# Patient Record
Sex: Male | Born: 1970
Health system: Southern US, Community
[De-identification: ages and names within clinical notes are randomized; demographics above are authoritative.]

## PROBLEM LIST (undated history)

## (undated) HISTORY — PX: APPENDECTOMY: SHX54

## (undated) HISTORY — PX: TONSILLECTOMY: SUR1361

---

## 2005-02-13 ENCOUNTER — Emergency Department (HOSPITAL_COMMUNITY): Admission: EM | Admit: 2005-02-13 | Discharge: 2005-02-13 | Payer: Self-pay | Admitting: Emergency Medicine

## 2014-10-18 ENCOUNTER — Emergency Department (HOSPITAL_COMMUNITY)
Admission: EM | Admit: 2014-10-18 | Discharge: 2014-10-18 | Disposition: A | Discharge status: Home / Self Care | Payer: Self-pay | Attending: Emergency Medicine | Admitting: Emergency Medicine

## 2014-10-18 ENCOUNTER — Encounter (HOSPITAL_COMMUNITY): Payer: Self-pay | Admitting: Emergency Medicine

## 2014-10-18 DIAGNOSIS — Y998 Other external cause status: Secondary | ICD-10-CM | POA: Insufficient documentation

## 2014-10-18 DIAGNOSIS — H109 Unspecified conjunctivitis: Secondary | ICD-10-CM | POA: Insufficient documentation

## 2014-10-18 DIAGNOSIS — T1501XA Foreign body in cornea, right eye, initial encounter: Secondary | ICD-10-CM | POA: Insufficient documentation

## 2014-10-18 DIAGNOSIS — Y9389 Activity, other specified: Secondary | ICD-10-CM | POA: Insufficient documentation

## 2014-10-18 DIAGNOSIS — Y9289 Other specified places as the place of occurrence of the external cause: Secondary | ICD-10-CM | POA: Insufficient documentation

## 2014-10-18 DIAGNOSIS — Z720 Tobacco use: Secondary | ICD-10-CM | POA: Insufficient documentation

## 2014-10-18 DIAGNOSIS — X58XXXA Exposure to other specified factors, initial encounter: Secondary | ICD-10-CM | POA: Insufficient documentation

## 2014-10-18 DIAGNOSIS — T1591XA Foreign body on external eye, part unspecified, right eye, initial encounter: Secondary | ICD-10-CM

## 2014-10-18 MED ORDER — NEOMYCIN-POLYMYXIN-HC 3.5-10000-1 OP SUSP: 3.0000 [drp] | Freq: Four times a day (QID) | OPHTHALMIC | Status: DC

## 2014-10-18 MED ORDER — TETRACAINE HCL 0.5 % OP SOLN
2.0000 [drp] | Freq: Once | OPHTHALMIC | Status: AC
  Administered 2014-10-18: 2 [drp] via OPHTHALMIC
  Filled 2014-10-18: qty 2

## 2014-10-18 MED ORDER — FLUORESCEIN SODIUM 1 MG OP STRP
1.0000 | ORAL_STRIP | Freq: Once | OPHTHALMIC | Status: AC
  Administered 2014-10-18: 1 via OPHTHALMIC
  Filled 2014-10-18: qty 1

## 2014-10-18 NOTE — ED Notes (Signed)
To ED from home, right eye sclera is reddened. Was working under a car and felt something fell in eye.

## 2014-10-18 NOTE — ED Provider Notes (Signed)
CSN: [id number]     Arrival date & time 10/18/14  1106 History  This chart was scribed for non-physician practitioner, Marlon Pel, PA-C working with Jerelyn Scott, MD, by Jarvis Morgan, ED Scribe. This patient was seen in room TR04C/TR04C and the patient's care was started at 12:25 PM.       Chief Complaint  Patient presents with  . Conjunctivitis     The history is provided by the patient. No language interpreter was used.    HPI Comments: Kirk Reeves is a 44 y.o. male who presents to the Emergency Department complaining constant, moderate, right eye pain onset 2 days ago due to a possible foreign body. Pt states he was working under a car and felt something fall into his eye. He reports associated right eye redness, itchiness, blurred vision and yellowish discharge. He endorses this morning there was a yellow crust that formed along the lash line. Pt states he has not taken any meds PTA. The pain is not exacerbated with eye movement. He denies any eye injury. Pt wears glasses.  He has an eye doctor that he follows up with regularly. He denies any photophobia.  History reviewed. No pertinent past medical history. Past Surgical History  Procedure Laterality Date  . Appendectomy    . Tonsillectomy     No family history on file. Social History  Substance Use Topics  . Smoking status: Current Every Day Smoker -- 0.50 packs/day    Types: Cigarettes  . Smokeless tobacco: None  . Alcohol Use: Yes     Comment: daily-- 6 pack    Review of Systems  Eyes: Positive for pain, discharge, redness, itching and visual disturbance (blurred). Negative for photophobia.  All other systems reviewed and are negative.     Allergies  Review of patient's allergies indicates not on file.  Home Medications   Prior to Admission medications   Medication Sig Start Date End Date Taking? Authorizing Provider  neomycin-polymyxin-hydrocortisone (CORTISPORIN) 3.5-10000-1 ophthalmic suspension Place  3 drops into the right eye 4 (four) times daily. For 7 days 10/18/14   Marlon Pel, PA-C   Triage Vitals: BP 148/98 mmHg  Pulse 82  Temp(Src) 98.7 F (37.1 C) (Oral)  Resp 16  Ht 5\' 10"  (1.778 m)  Wt 215 lb (97.523 kg)  BMI 30.85 kg/m2  SpO2 98%  Physical Exam  Constitutional: He is oriented to person, place, and time. He appears well-developed and well-nourished. No distress.  HENT:  Head: Normocephalic and atraumatic.  Eyes: EOM and lids are normal. Pupils are equal, round, and reactive to light. Right conjunctiva is injected.  Slit lamp exam:      The right eye shows corneal abrasion (2 small corneal abrasions at 7:00 and 9:00 right outside sclera), foreign body and fluorescein uptake.    Visual Acuity - Bilateral Distance: 20/15 ; R Distance: 20/20 ; L Distance: 20/15  FB noted, eyelash to base of eye.  Neck: Neck supple. No tracheal deviation present.  Cardiovascular: Normal rate.   Pulmonary/Chest: Effort normal. No respiratory distress.  Musculoskeletal: Normal range of motion.  Neurological: He is alert and oriented to person, place, and time.  Skin: Skin is warm and dry.  Psychiatric: He has a normal mood and affect. His behavior is normal.  Nursing note and vitals reviewed.   ED Course  FOREIGN BODY REMOVAL Date/Time: 10/18/2014 12:51 PM Performed by: Marlon Pel Authorized by: Marlon Pel Consent: Verbal consent obtained. Risks and benefits: risks, benefits and alternatives were discussed Consent  given by: patient Patient understanding: patient states understanding of the procedure being performed Patient identity confirmed: verbally with patient Body area: eye Location details: right conjunctiva Anesthesia: see MAR for details Local anesthetic: tetracaine drops Anesthetic total: 2 drops Patient sedated: no Localization method: eyelid eversion Eye examined with fluorescein. Fluorescein uptake. Corneal abrasion size: small Corneal abrasion  location: lateral No residual rust ring present. Dressing: antibiotic drops Depth: superficial Complexity: simple 1 objects recovered. Objects recovered: eye liash Post-procedure assessment: foreign body removed Patient tolerance: Patient tolerated the procedure well with no immediate complications   (including critical care time)  DIAGNOSTIC STUDIES: Oxygen Saturation is 98% on RA, normal by my interpretation.    COORDINATION OF CARE: 12:50 PM- Pt advised of plan for treatment and pt agrees. Will prescribe pt with  neomycin-polymyxin-hydrocortisone (CORTISPORIN) 3.5-10000-1 ophthalmic suspension Place 3 drops into the right eye 4 (four) times daily. For 7 days 7.5 mL Marlon Pel, PA-C   PT will follow-up with his eye doctor before the weekend  Labs Review Labs Reviewed - No data to display  Imaging Review No results found. I have personally reviewed and evaluated these images and lab results as part of my medical decision-making.   EKG Interpretation None      MDM   Final diagnoses:  Conjunctivitis of right eye  Eye foreign body, right, initial encounter    Medications  fluorescein ophthalmic strip 1 strip (1 strip Both Eyes Given by Other 10/18/14 1217)  tetracaine (PONTOCAINE) 0.5 % ophthalmic solution 2 drop (2 drops Both Eyes Given 10/18/14 1217)    44 y.o.Kirk Reeves's evaluation in the Emergency Department is complete. It has been determined that no acute conditions requiring further emergency intervention are present at this time. The patient/guardian have been advised of the diagnosis and plan. We have discussed signs and symptoms that warrant return to the ED, such as changes or worsening in symptoms.  Vital signs are stable at discharge. Filed Vitals:   10/18/14 1121  BP: 148/98  Pulse: 82  Temp: 98.7 F (37.1 C)  Resp: 16    Patient/guardian has voiced understanding and agreed to follow-up with the PCP or specialist.  I personally performed  the services described in this documentation, which was scribed in my presence. The recorded information has been reviewed and is accurate.   Marlon Pel, PA-C 10/18/14 1254  Jerelyn Scott, MD 10/18/14 1322

## 2014-10-18 NOTE — Discharge Instructions (Signed)
Corneal Abrasion °The cornea is the clear covering at the front and center of the eye. When looking at the colored portion of the eye (iris), you are looking through the cornea. This very thin tissue is made up of many layers. The surface layer is a single layer of cells (corneal epithelium) and is one of the most sensitive tissues in the body. If a scratch or injury causes the corneal epithelium to come off, it is called a corneal abrasion. If the injury extends to the tissues below the epithelium, the condition is called a corneal ulcer. °CAUSES  °· Scratches. °· Trauma. °· Foreign body in the eye. °Some people have recurrences of abrasions in the area of the original injury even after it has healed (recurrent erosion syndrome). Recurrent erosion syndrome generally improves and goes away with time. °SYMPTOMS  °· Eye pain. °· Difficulty or inability to keep the injured eye open. °· The eye becomes very sensitive to light. °· Recurrent erosions tend to happen suddenly, first thing in the morning, usually after waking up and opening the eye. °DIAGNOSIS  °Your health care provider can diagnose a corneal abrasion during an eye exam. Dye is usually placed in the eye using a drop or a small paper strip moistened by your tears. When the eye is examined with a special light, the abrasion shows up clearly because of the dye. °TREATMENT  °· Small abrasions may be treated with antibiotic drops or ointment alone. °· A pressure patch may be put over the eye. If this is done, follow your doctor's instructions for when to remove the patch. Do not drive or use machines while the eye patch is on. Judging distances is hard to do with a patch on. °If the abrasion becomes infected and spreads to the deeper tissues of the cornea, a corneal ulcer can result. This is serious because it can cause corneal scarring. Corneal scars interfere with light passing through the cornea and cause a loss of vision in the involved eye. °HOME CARE  INSTRUCTIONS °· Use medicine or ointment as directed. Only take over-the-counter or prescription medicines for pain, discomfort, or fever as directed by your health care provider. °· Do not drive or operate machinery if your eye is patched. Your ability to judge distances is impaired. °· If your health care provider has given you a follow-up appointment, it is very important to keep that appointment. Not keeping the appointment could result in a severe eye infection or permanent loss of vision. If there is any problem keeping the appointment, let your health care provider know. °SEEK MEDICAL CARE IF:  °· You have pain, light sensitivity, and a scratchy feeling in one eye or both eyes. °· Your pressure patch keeps loosening up, and you can blink your eye under the patch after treatment. °· Any kind of discharge develops from the eye after treatment or if the lids stick together in the morning. °· You have the same symptoms in the morning as you did with the original abrasion days, weeks, or months after the abrasion healed. °MAKE SURE YOU:  °· Understand these instructions. °· Will watch your condition. °· Will get help right away if you are not doing well or get worse. °Document Released: 01/18/2000 Document Revised: 01/25/2013 Document Reviewed: 09/27/2012 °ExitCare® Patient Information ©2015 [employer]. This information is not intended to replace advice given to you by your health care provider. Make sure you discuss any questions you have with your health care provider. ° °

## 2016-05-07 DIAGNOSIS — H52221 Regular astigmatism, right eye: Secondary | ICD-10-CM | POA: Diagnosis not present

## 2016-05-07 DIAGNOSIS — H5213 Myopia, bilateral: Secondary | ICD-10-CM | POA: Diagnosis not present

## 2016-05-07 DIAGNOSIS — H524 Presbyopia: Secondary | ICD-10-CM | POA: Diagnosis not present

## 2016-10-15 ENCOUNTER — Encounter: Payer: Self-pay | Admitting: Physician Assistant

## 2016-10-15 ENCOUNTER — Ambulatory Visit (INDEPENDENT_AMBULATORY_CARE_PROVIDER_SITE_OTHER): Payer: BLUE CROSS/BLUE SHIELD | Admitting: Physician Assistant

## 2016-10-15 VITALS — BP 122/90 | HR 80 | Temp 98.1°F | Resp 12 | Ht 71.0 in | Wt 199.0 lb

## 2016-10-15 DIAGNOSIS — Z23 Encounter for immunization: Secondary | ICD-10-CM | POA: Diagnosis not present

## 2016-10-15 DIAGNOSIS — Z7689 Persons encountering health services in other specified circumstances: Secondary | ICD-10-CM

## 2016-10-15 DIAGNOSIS — Z0000 Encounter for general adult medical examination without abnormal findings: Secondary | ICD-10-CM | POA: Diagnosis not present

## 2016-10-15 DIAGNOSIS — M67432 Ganglion, left wrist: Secondary | ICD-10-CM | POA: Insufficient documentation

## 2016-10-15 DIAGNOSIS — F1210 Cannabis abuse, uncomplicated: Secondary | ICD-10-CM | POA: Diagnosis not present

## 2016-10-15 DIAGNOSIS — Z720 Tobacco use: Secondary | ICD-10-CM | POA: Diagnosis not present

## 2016-10-15 DIAGNOSIS — F1010 Alcohol abuse, uncomplicated: Secondary | ICD-10-CM | POA: Diagnosis not present

## 2016-10-15 NOTE — Progress Notes (Signed)
Patient ID: Kirk DollyBarry Reeves MRN: [id number], DOB: 04-07-1970 46 y.o. Date of Encounter: 10/15/2016, 11:18 AM    Chief Complaint: New Patient. Establish Care. Physical (CPE)  HPI: 46 y.o. y/o male here for above.   He reports that he "hasn't gone to a doctor" for any type of check up since he was in high school when he needed sports physicals etc. Says that, given his age, he decided it was time for him to come get a checkup. Also says that his children "keep getting after him about being healthier ". Also says that he does have these areas on his left wrist that have gotten bigger and definitely needed to come see a medical provider regarding those.  Reports that he noticed lesion on left wrist but they have recently gotten larger.  He has no other specific concerns to address.  He has no known medical history. Reviewed his family history which is documented. There is no significant premature family history of CAD or CA.   Social history: He states that he works at [employer]. Reports that his schedule there changes from day to day. 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 During conversation makes reference to his daughter so then obtained further history regarding children: States his youngest daughter is 1416 and she lives with her mother "[place]" so he sees her fairly often. Says that he has a son in the military who is in [place] Says that he has another daughter who is married to someone in the [employer], they live in [place]  Reports that he smokes about 1/2 pack per day. Says that he "has smoked consistently for about 15 or 16 years." Reports that he drinks 6 pack of beer each day. Reports that he also smokes marijuana  When I further discuss effects of smoking and alcohol on his health, asked whether he has given it much thought,,, asked whether he  has ever tried to decrease or quit etc. As part of his answer, he states that for as long as he can remember he drank a beer to help him sleep. Sounds like it started that way and then just progressed to being 6 beers at night. He also states that "they haven't seemed to be causing any problems. Doesn't have a cough. Doesn't feel any hangover, I'm not out there walking around staggering like a drunk."    Review of Systems: Consitutional: No fever, chills, fatigue, night sweats, lymphadenopathy, or weight changes. Eyes: No visual changes, eye redness, or discharge. ENT/Mouth: Ears: No otalgia, tinnitus, hearing loss, discharge. Nose: No congestion, rhinorrhea, sinus pain, or epistaxis. Throat: No sore throat, post nasal drip, or teeth pain. Cardiovascular: No CP, palpitations, diaphoresis, DOE, edema, orthopnea, PND. Respiratory: No cough, hemoptysis, SOB, or wheezing. Gastrointestinal: No anorexia, dysphagia, reflux, pain, nausea, vomiting, hematemesis, diarrhea, constipation, BRBPR, or melena. Genitourinary: No dysuria, frequency, urgency, hematuria, incontinence, nocturia, decreased urinary stream, discharge, impotence, or testicular pain/masses. Musculoskeletal: No decreased ROM, myalgias, stiffness, joint swelling, or weakness. Skin: No rash, erythema, lesion changes, pain, warmth, jaundice, or pruritis. Neurological: No headache, dizziness, syncope, seizures, tremors, memory loss, coordination problems, or paresthesias. Psychological: No anxiety, depression, hallucinations, SI/HI. Endocrine: No fatigue, polydipsia, polyphagia, polyuria, or known diabetes. All other systems were reviewed and are otherwise negative.   History reviewed. No pertinent past medical history.   Past Surgical History:  Procedure Laterality Date  . APPENDECTOMY    . TONSILLECTOMY      Home Meds:  Outpatient Medications Prior to Visit  Medication Sig Dispense Refill  . neomycin-polymyxin-hydrocortisone  (CORTISPORIN) 3.5-10000-1 ophthalmic suspension Place 3 drops into the right eye 4 (four) times daily. For 7 days 7.5 mL 0   No facility-administered medications prior to visit.     Allergies: No Known Allergies  Social History   Social History  . Marital status: Single    Spouse name: N/A  . Number of children: N/A  . Years of education: N/A   Occupational History  . Not on file.   Social History Main Topics  . Smoking status: Current Every Day Smoker    Packs/day: 0.50    Types: Cigarettes  . Smokeless tobacco: Never Used  . Alcohol use Yes     Comment: daily-- 6 pack  . Drug use: No  . Sexual activity: Not on file   Other Topics Concern  . Not on file   Social History Narrative  . No narrative on file    Family History  Problem Relation Age of Onset  . Gout Father     Physical Exam: Blood pressure 122/90, pulse 80, temperature 98.1 F (36.7 C), temperature source Oral, resp. rate 12, height  (1.803 m), weight 199 lb (90.3 kg), SpO2 98 %.  General: Well developed, well nourished. Appears in no acute distress. HEENT: Normocephalic, atraumatic. Conjunctiva pink, sclera non-icteric. Pupils 2 mm constricting to 1 mm, round, regular, and equally reactive to light and accomodation. EOMI. Internal auditory canal clear. TMs with good cone of light and without pathology. Nasal mucosa pink. Nares are without discharge. No sinus tenderness. Oral mucosa pink. Dentition very poor. Obvious dental caries, visibly brown, visibly decayed down toward gums.  Neck: Supple. Trachea midline. No thyromegaly. Full ROM. No lymphadenopathy.No carotid bruits. Lungs: Clear to auscultation bilaterally without wheezes, rales, or rhonchi. Breathing is of normal effort and unlabored. Cardiovascular: RRR with S1 S2. No murmurs, rubs, or gallops. Distal pulses 2+ symmetrically. No carotid or abdominal bruits. Abdomen: Soft, non-tender, non-distended with normoactive bowel sounds. No  hepatosplenomegaly or masses. No rebound/guarding. No CVA tenderness. No hernias. Musculoskeletal: Full range of motion and 5/5 strength throughout.  Left Wrist: Volar Aspect of wrist: There is one large cyst (~1cm diameter) there are 2 smaller cysts (each ~ 0.25 cm) No erythema. No tenderness. Skin: Warm and moist without erythema, ecchymosis, wounds, or rash. Neuro: A+Ox3. CN II-XII grossly intact. Moves all extremities spontaneously. Full sensation throughout. Normal gait.  Psych:  Responds to questions appropriately with a normal affect.   Assessment/Plan:  46 y.o. y/o AA male here for CPE  -1. Encounter to establish care  2. Encounter for preventive health examination  A. Screening Labs: He is not fasting but to return fasting Friday morning for labs. Follow-up those results at that time. - CBC with Differential/Platelet; Future - COMPLETE METABOLIC PANEL WITH GFR; Future - Lipid panel; Future - PSA; Future - TSH; Future - HIV antibody; Future  B. Screening For Prostate Cancer: - PSA; Future  C. Screening For Colorectal Cancer:  He has no indication to require this screening until age 4.  D. Immunizations: Flu-------------------- recommended influenza vaccine but he defers. Tetanus-----he has had no tetanus vaccine in > 10 years. Agreeable to update this today. T dap given here 10/15/2016 Pneumococcal--------- given that he is a smoker he does need Pneumovax 23. Give this at his next visit. Shingrix------------------- will  discuss at age 59     3. Cyst of left wrist Discussed that needs follow up and further management by hand specialist. He voices understanding and agrees to follow-up with them. - Ambulatory referral to Orthopedic Surgery  4. Alcohol abuse Discussed long-term effects on his health including, but not limited to, effects to liver.  Discussed for him to start giving this some thought.  Discussed for him to start trying to at least decrease the amount  of alcohol consumption. Discussed that we will f/u at this discussion at future visits.   5. Tobacco abuse Discussed long-term effects on his health including, but not limited to, cancer, cardiovascular disease, as well as many additional health problems.  Discussed for him to start giving this some thought.  Discussed for him to start trying to at least decrease the amount of smoking. Discussed that we will f/u at this discussion at future visits.     6. Marijuana abuse Discussed long-term effects on his health including, but not limited to, effects to brain.  Discussed for him to start giving this some thought.  Discussed for him to start trying to at least decrease the amount/frequency of marijuana.. Discussed that we will f/u at this discussion at future visits.       Signed:   [address] [place], [place]  10/15/2016 11:18 AM

## 2016-10-17 ENCOUNTER — Other Ambulatory Visit: Payer: BLUE CROSS/BLUE SHIELD

## 2016-10-17 DIAGNOSIS — Z0000 Encounter for general adult medical examination without abnormal findings: Secondary | ICD-10-CM

## 2016-10-17 DIAGNOSIS — E1165 Type 2 diabetes mellitus with hyperglycemia: Secondary | ICD-10-CM | POA: Diagnosis not present

## 2016-10-23 LAB — CBC WITH DIFFERENTIAL/PLATELET
BASOS ABS: 112 {cells}/uL (ref 0–200)
Basophils Relative: 1.1 %
EOS ABS: 439 {cells}/uL (ref 15–500)
EOS PCT: 4.3 %
HEMATOCRIT: 43.1 % (ref 38.5–50.0)
HEMOGLOBIN: 15 g/dL (ref 13.2–17.1)
LYMPHS ABS: 2224 {cells}/uL (ref 850–3900)
MCH: 30.9 pg (ref 27.0–33.0)
MCHC: 34.8 g/dL (ref 32.0–36.0)
MCV: 88.9 fL (ref 80.0–100.0)
MPV: 9.9 fL (ref 7.5–12.5)
Monocytes Relative: 7.8 %
NEUTROS ABS: 6630 {cells}/uL (ref 1500–7800)
NEUTROS PCT: 65 %
Platelets: 373 10*3/uL (ref 140–400)
RBC: 4.85 10*6/uL (ref 4.20–5.80)
RDW: 15.5 % — AB (ref 11.0–15.0)
Total Lymphocyte: 21.8 %
WBC: 10.2 10*3/uL (ref 3.8–10.8)
WBCMIX: 796 {cells}/uL (ref 200–950)

## 2016-10-23 LAB — COMPLETE METABOLIC PANEL WITH GFR
AG Ratio: 1.8 (calc) (ref 1.0–2.5)
ALKALINE PHOSPHATASE (APISO): 65 U/L (ref 40–115)
ALT: 25 U/L (ref 9–46)
AST: 28 U/L (ref 10–40)
Albumin: 4.2 g/dL (ref 3.6–5.1)
BILIRUBIN TOTAL: 0.5 mg/dL (ref 0.2–1.2)
BUN: 12 mg/dL (ref 7–25)
CHLORIDE: 107 mmol/L (ref 98–110)
CO2: 23 mmol/L (ref 20–32)
CREATININE: 1.12 mg/dL (ref 0.60–1.35)
Calcium: 9.5 mg/dL (ref 8.6–10.3)
GFR, Est African American: 92 mL/min/{1.73_m2} (ref >=60)
GFR, Est Non African American: 79 mL/min/{1.73_m2} (ref >=60)
GLOBULIN: 2.3 g/dL (ref 1.9–3.7)
GLUCOSE: 111 mg/dL — AB (ref 65–99)
POTASSIUM: 4.7 mmol/L (ref 3.5–5.3)
SODIUM: 139 mmol/L (ref 135–146)
Total Protein: 6.5 g/dL (ref 6.1–8.1)

## 2016-10-23 LAB — LIPID PANEL
CHOL/HDL RATIO: 3.1 (calc) (ref <5.0)
CHOLESTEROL: 144 mg/dL (ref <200)
HDL: 46 mg/dL (ref >40)
LDL CHOLESTEROL (CALC): 73 mg/dL
Non-HDL Cholesterol (Calc): 98 mg/dL (calc) (ref <130)
TRIGLYCERIDES: 169 mg/dL — AB (ref <150)

## 2016-10-23 LAB — PSA: PSA: 0.4 ng/mL (ref <=4.0)

## 2016-10-23 LAB — TSH: TSH: 1.54 mIU/L (ref 0.40–4.50)

## 2016-10-23 LAB — HIV ANTIBODY (ROUTINE TESTING W REFLEX): HIV: NONREACTIVE

## 2016-10-23 LAB — HEMOGLOBIN A1C W/OUT EAG: HEMOGLOBIN A1C: 4.9 %{Hb} (ref <5.7)

## 2016-10-24 ENCOUNTER — Encounter: Payer: Self-pay | Admitting: Physician Assistant

## 2016-10-24 ENCOUNTER — Encounter (INDEPENDENT_AMBULATORY_CARE_PROVIDER_SITE_OTHER): Payer: Self-pay | Admitting: Orthopaedic Surgery

## 2016-10-24 ENCOUNTER — Ambulatory Visit (INDEPENDENT_AMBULATORY_CARE_PROVIDER_SITE_OTHER): Payer: BLUE CROSS/BLUE SHIELD

## 2016-10-24 ENCOUNTER — Ambulatory Visit (INDEPENDENT_AMBULATORY_CARE_PROVIDER_SITE_OTHER): Payer: BLUE CROSS/BLUE SHIELD | Admitting: Orthopaedic Surgery

## 2016-10-24 DIAGNOSIS — M67432 Ganglion, left wrist: Secondary | ICD-10-CM | POA: Diagnosis not present

## 2016-10-24 NOTE — Progress Notes (Signed)
   Office Visit Note   Patient: Kirk Reeves           Date of Birth: 1970/12/19           MRN: [id number] Visit Date: 10/24/2016              Requested by: Dorena Bodo, PA-C [address] [place] [address] PCP: Dorena Bodo, PA-C   Assessment & Plan: Visit Diagnoses:  1. Ganglion cyst of volar aspect of left wrist     Plan: MRI of the left wrist to rule out malignancy given atypical appearance. Follow-up after the MRI.  Follow-Up Instructions: Return in about 10 days (around 11/03/2016).   Orders:  Orders Placed This Encounter  Procedures  . XR Wrist Complete Left  . MR Wrist Left w/o contrast   No orders of the defined types were placed in this encounter.     Procedures: No procedures performed   Clinical Data: No additional findings.   Subjective: Chief Complaint  Patient presents with  . Left Wrist - Pain, Follow-up    Patient is a 46 year old right-hand dominant gentleman who comes in with enlarging volar wrist cyst on the left wrist. He denies any constitutional symptoms. Denies any injuries. This bothers him at work. He works as a Nutritional therapist. He feels that this has been enlarging. He has occasional numbness. Denies denies any significant pain    Review of Systems  Constitutional: Negative.   All other systems reviewed and are negative.    Objective: Vital Signs: There were no vitals taken for this visit.  Physical Exam  Constitutional: He is oriented to person, place, and time. He appears well-developed and well-nourished.  HENT:  Head: Normocephalic and atraumatic.  Eyes: Pupils are equal, round, and reactive to light.  Neck: Neck supple.  Pulmonary/Chest: Effort normal.  Abdominal: Soft.  Musculoskeletal: Normal range of motion.  Neurological: He is alert and oriented to person, place, and time.  Skin: Skin is warm.  Psychiatric: He has a normal mood and affect. His behavior is normal. Judgment and thought content  normal.  Nursing note and vitals reviewed.   Ortho Exam Left wrist exam shows 3 cysts joined together on the volar aspect of the wrist overlying the FCR tendon. He has a thready radial artery pulse. Hand is warm and well-perfused. No overlying skin changes. Specialty Comments:  No specialty comments available.  Imaging: Xr Wrist Complete Left  Result Date: 10/24/2016 Negative for acute or structural modalities    PMFS History: Patient Active Problem List   Diagnosis Date Noted  . Ganglion cyst of volar aspect of left wrist 10/24/2016  . Ganglion cyst of dorsum of left wrist 10/15/2016  . Alcohol abuse 10/15/2016  . Tobacco abuse 10/15/2016  . Marijuana abuse 10/15/2016   No past medical history on file.  Family History  Problem Relation Age of Onset  . Gout Father     Past Surgical History:  Procedure Laterality Date  . APPENDECTOMY    . TONSILLECTOMY     Social History   Occupational History  . Not on file.   Social History Main Topics  . Smoking status: Current Every Day Smoker    Packs/day: 0.50    Types: Cigarettes  . Smokeless tobacco: Never Used  . Alcohol use Yes     Comment: daily-- 6 pack  . Drug use: No  . Sexual activity: Not on file

## 2016-10-30 ENCOUNTER — Ambulatory Visit
Admission: RE | Admit: 2016-10-30 | Discharge: 2016-10-30 | Disposition: A | Discharge status: Home / Self Care | Payer: BLUE CROSS/BLUE SHIELD | Source: Ambulatory Visit | Attending: Orthopaedic Surgery | Admitting: Orthopaedic Surgery

## 2016-10-30 DIAGNOSIS — M7989 Other specified soft tissue disorders: Secondary | ICD-10-CM | POA: Diagnosis not present

## 2016-10-30 DIAGNOSIS — M67432 Ganglion, left wrist: Secondary | ICD-10-CM

## 2016-11-07 ENCOUNTER — Ambulatory Visit (INDEPENDENT_AMBULATORY_CARE_PROVIDER_SITE_OTHER): Payer: BLUE CROSS/BLUE SHIELD | Admitting: Orthopaedic Surgery

## 2016-11-08 ENCOUNTER — Other Ambulatory Visit: Payer: Self-pay

## 2017-02-02 ENCOUNTER — Encounter: Payer: Self-pay | Admitting: Physician Assistant

## 2017-03-02 ENCOUNTER — Encounter: Payer: Self-pay | Admitting: Physician Assistant

## 2017-10-16 ENCOUNTER — Ambulatory Visit
Admission: RE | Admit: 2017-10-16 | Discharge: 2017-10-16 | Disposition: A | Discharge status: Home / Self Care | Payer: No Typology Code available for payment source | Source: Ambulatory Visit | Attending: *Deleted | Admitting: *Deleted

## 2017-10-16 ENCOUNTER — Other Ambulatory Visit: Payer: Self-pay | Admitting: *Deleted

## 2017-10-16 DIAGNOSIS — R7611 Nonspecific reaction to tuberculin skin test without active tuberculosis: Secondary | ICD-10-CM

## 2017-10-19 ENCOUNTER — Encounter: Payer: BLUE CROSS/BLUE SHIELD | Admitting: Physician Assistant

## 2018-05-14 ENCOUNTER — Other Ambulatory Visit: Payer: Self-pay

## 2018-05-14 ENCOUNTER — Emergency Department (HOSPITAL_COMMUNITY): Payer: BLUE CROSS/BLUE SHIELD

## 2018-05-14 ENCOUNTER — Emergency Department (HOSPITAL_COMMUNITY)
Admission: EM | Admit: 2018-05-14 | Discharge: 2018-05-14 | Disposition: A | Discharge status: Home / Self Care | Payer: BLUE CROSS/BLUE SHIELD | Attending: Emergency Medicine | Admitting: Emergency Medicine

## 2018-05-14 ENCOUNTER — Encounter (HOSPITAL_COMMUNITY): Payer: Self-pay | Admitting: Emergency Medicine

## 2018-05-14 DIAGNOSIS — R079 Chest pain, unspecified: Secondary | ICD-10-CM | POA: Diagnosis not present

## 2018-05-14 DIAGNOSIS — F17210 Nicotine dependence, cigarettes, uncomplicated: Secondary | ICD-10-CM | POA: Diagnosis not present

## 2018-05-14 LAB — CBC
HCT: 42.3 % (ref 39.0–52.0)
Hemoglobin: 15.3 g/dL (ref 13.0–17.0)
MCH: 30.2 pg (ref 26.0–34.0)
MCHC: 36.2 g/dL — ABNORMAL HIGH (ref 30.0–36.0)
MCV: 83.6 fL (ref 80.0–100.0)
Platelets: 415 10*3/uL — ABNORMAL HIGH (ref 150–400)
RBC: 5.06 MIL/uL (ref 4.22–5.81)
RDW: 14.2 % (ref 11.5–15.5)
WBC: 11.4 10*3/uL — ABNORMAL HIGH (ref 4.0–10.5)
nRBC: 0 % (ref 0.0–0.2)

## 2018-05-14 LAB — BASIC METABOLIC PANEL
Anion gap: 15 (ref 5–15)
BUN: 13 mg/dL (ref 6–20)
CO2: 22 mmol/L (ref 22–32)
Calcium: 9.4 mg/dL (ref 8.9–10.3)
Chloride: 103 mmol/L (ref 98–111)
Creatinine, Ser: 1.35 mg/dL — ABNORMAL HIGH (ref 0.61–1.24)
GFR calc Af Amer: >60 mL/min (ref >60)
GFR calc non Af Amer: >60 mL/min (ref >60)
Glucose, Bld: 112 mg/dL — ABNORMAL HIGH (ref 70–99)
Potassium: 4.2 mmol/L (ref 3.5–5.1)
Sodium: 140 mmol/L (ref 135–145)

## 2018-05-14 LAB — TROPONIN I: Troponin I: <0.03 ng/mL (ref <0.03)

## 2018-05-14 MED ORDER — SIMETHICONE 80 MG PO CHEW
160.0000 mg | CHEWABLE_TABLET | Freq: Once | ORAL | Status: AC
  Administered 2018-05-14: 160 mg via ORAL
  Filled 2018-05-14: qty 2

## 2018-05-14 MED ORDER — ALUM & MAG HYDROXIDE-SIMETH 200-200-20 MG/5ML PO SUSP
30.0000 mL | Freq: Once | ORAL | Status: AC
  Administered 2018-05-14: 30 mL via ORAL
  Filled 2018-05-14: qty 30

## 2018-05-14 MED ORDER — LIDOCAINE VISCOUS HCL 2 % MT SOLN
15.0000 mL | Freq: Once | OROMUCOSAL | Status: AC
  Administered 2018-05-14: 15 mL via ORAL
  Filled 2018-05-14: qty 15

## 2018-05-14 MED ORDER — SODIUM CHLORIDE 0.9% FLUSH: 3.0000 mL | Freq: Once | INTRAVENOUS | Status: DC

## 2018-05-14 MED ORDER — SIMETHICONE 80 MG PO CHEW: 80.0000 mg | CHEWABLE_TABLET | Freq: Four times a day (QID) | ORAL | 0 refills | Status: AC | PRN

## 2018-05-14 MED ORDER — FAMOTIDINE 20 MG PO TABS: 20.0000 mg | ORAL_TABLET | Freq: Two times a day (BID) | ORAL | 0 refills | Status: AC

## 2018-05-14 NOTE — Discharge Instructions (Signed)
Take the prescribed medication as directed. See the attached information about acid reflux, diet modification, etc. Follow-up with your primary care doctor. Return to the ED for new or worsening symptoms.

## 2018-05-14 NOTE — ED Provider Notes (Signed)
[facility] EMERGENCY DEPARTMENT Provider Note   CSN: [id number] Arrival date & time: 05/14/18  0319    History   Chief Complaint Chief Complaint  Patient presents with  . Chest Pain    HPI Kirk Reeves is a 48 y.o. male.     The history is provided by the patient and medical records.  Chest Pain     48 y.o. M with history of EtOH, marijuana abuse, presenting to the ED for chest pain.  States initially pain was intermittent but over the past 24 hours has been constant.  Pain localized to the midsternal region and described as a "burning" sensation.  He denies any diaphoresis, nausea, vomiting, dizziness, lightheadedness, or feelings of syncope.  He has not noticed any shortness of breath, cough, fever or other URI type symptoms.  No sick contacts.  He has no known cardiac history.  He is a current smoker, about half pack per day.  Patient states lately his diet has been poor as he has not been working as much as normal.  Yesterday he had spaghetti and meatballs, [employer], [place] [name] sub-, and ribs with chicken tenders.  States generally he does not have any issues with heartburn.  He did try some Rolaids prior to arrival without much change.  History reviewed. No pertinent past medical history.  Patient Active Problem List   Diagnosis Date Noted  . Ganglion cyst of volar aspect of left wrist 10/24/2016  . Ganglion cyst of dorsum of left wrist 10/15/2016  . Alcohol abuse 10/15/2016  . Tobacco abuse 10/15/2016  . Marijuana abuse 10/15/2016    Past Surgical History:  Procedure Laterality Date  . APPENDECTOMY    . TONSILLECTOMY          Home Medications    Prior to Admission medications   Not on File    Family History Family History  Problem Relation Age of Onset  . Gout Father     Social History Social History   Tobacco Use  . Smoking status: Current Every Day Smoker    Packs/day: 0.50    Types: Cigarettes  . Smokeless tobacco:  Never Used  Substance Use Topics  . Alcohol use: Yes    Comment: daily-- 6 pack  . Drug use: No     Allergies   Patient has no known allergies.   Review of Systems Review of Systems  Cardiovascular: Positive for chest pain.  All other systems reviewed and are negative.    Physical Exam Updated Vital Signs BP (!) 163/96 (BP Location: Right Arm)   Pulse 81   Temp 98.3 F (36.8 C) (Oral)   Resp 18   Ht 5\' 11"  (1.803 m)   Wt 90.7 kg   SpO2 100%   BMI 27.89 kg/m   Physical Exam Vitals signs and nursing note reviewed.  Constitutional:      Appearance: He is well-developed.  HENT:     Head: Normocephalic and atraumatic.  Eyes:     Conjunctiva/sclera: Conjunctivae normal.     Pupils: Pupils are equal, round, and reactive to light.  Neck:     Musculoskeletal: Normal range of motion.  Cardiovascular:     Rate and Rhythm: Normal rate and regular rhythm.     Heart sounds: Normal heart sounds.  Pulmonary:     Effort: Pulmonary effort is normal.     Breath sounds: Normal breath sounds. No decreased breath sounds, wheezing or rhonchi.  Abdominal:     General: Bowel  sounds are normal.     Palpations: Abdomen is soft.  Musculoskeletal: Normal range of motion.  Skin:    General: Skin is warm and dry.  Neurological:     Mental Status: He is alert and oriented to person, place, and time.      ED Treatments / Results  Labs (all labs ordered are listed, but only abnormal results are displayed) Labs Reviewed  BASIC METABOLIC PANEL - Abnormal; Notable for the following components:      Result Value   Glucose, Bld 112 (*)    Creatinine, Ser 1.35 (*)    All other components within normal limits  CBC - Abnormal; Notable for the following components:   WBC 11.4 (*)    MCHC 36.2 (*)    Platelets 415 (*)    All other components within normal limits  TROPONIN I    EKG None  ED ECG REPORT   Date: 05/14/2018  Rate: 76  Rhythm: normal sinus rhythm  QRS Axis: normal   Intervals: normal  ST/T Wave abnormalities: normal  Conduction Disutrbances:none  Narrative Interpretation:   Old EKG Reviewed: none available  I have personally reviewed the EKG tracing and agree with the computerized printout as noted.   Radiology Dg Chest 2 View  Result Date: 05/14/2018 CLINICAL DATA:  Chest pain. EXAM: CHEST - 2 VIEW COMPARISON:  10/16/2017 FINDINGS: The cardiomediastinal contours are normal. The lungs are clear. Pulmonary vasculature is normal. No consolidation, pleural effusion, or pneumothorax. No acute osseous abnormalities are seen. IMPRESSION: Negative radiographs of the chest. Electronically Signed   By: Narda RutherfordMelanie  Sanford M.D.   On: 05/14/2018 03:53    Procedures Procedures (including critical care time)  Medications Ordered in ED Medications  sodium chloride flush (NS) 0.9 % injection 3 mL (3 mLs Intravenous Not Given 05/14/18 0329)  alum & mag hydroxide-simeth (MAALOX/MYLANTA) 200-200-20 MG/5ML suspension 30 mL (30 mLs Oral Given 05/14/18 0412)    And  lidocaine (XYLOCAINE) 2 % viscous mouth solution 15 mL (15 mLs Oral Given 05/14/18 0412)  simethicone (MYLICON) chewable tablet 160 mg (160 mg Oral Given 05/14/18 0520)     Initial Impression / Assessment and Plan / ED Course  I have reviewed the triage vital signs and the nursing notes.  Pertinent labs & imaging results that were available during my care of the patient were reviewed by me and considered in my medical decision making (see chart for details).  48 year old male here with chest pain, central in nature.  Intermittent for the past few days but became constant yesterday.  Pain is described as burning.  He does admit to a poor diet recently since he has been home more than normal.  He is afebrile and nontoxic in appearance.  His exam is overall benign.  He has not had any recent cough fever, or other URI type symptoms.  No recent sick contacts, travel, or COVID exposures.  Plan for screening labs,  CXR.  Given GI cocktail.  EKG NSR, no acute ischemic changes.  Labs reassuring.  Trop negative. CXR clear.  Patient feeling better after GI cocktail and gas-x.  We have discussed results, he is reassured.  Feel this is likely GERD, less likely ACS, PE, dissection, or other acute cardiac event.  Appears stable for discharge.  Will start trial of pepcid, can continue gas-x PRN.  Recommended diet modification, taking note of any trigger foods.  He can follow-up with PCP.  Return here for any new/acute changes.  Final Clinical Impressions(s) /  ED Diagnoses   Final diagnoses:  Chest pain in adult    ED Discharge Orders         Ordered    famotidine (PEPCID) 20 MG tablet  2 times daily     05/14/18 0525    simethicone (GAS-X) 80 MG chewable tablet  Every 6 hours PRN     05/14/18 0525           Garlon Hatchet, PA-C 05/14/18 0541    Gilda Crease, MD 05/15/18 [phone]

## 2018-05-14 NOTE — ED Triage Notes (Signed)
Pt c/o 4/10 right side cp for the past few days, pt states is a constant pain, like heart burn, pt denies any SOB, no cough, no changed on his taste of smell, no traveling.

## 2018-11-22 ENCOUNTER — Other Ambulatory Visit: Payer: Self-pay

## 2018-11-22 DIAGNOSIS — Z20822 Contact with and (suspected) exposure to covid-19: Secondary | ICD-10-CM

## 2018-11-23 LAB — NOVEL CORONAVIRUS, NAA: SARS-CoV-2, NAA: NOT DETECTED

## 2020-09-16 IMAGING — CR CHEST - 2 VIEW
2 series · 2 of 2 positions shown · non-contrast
Comparison: 10/16/2017

CLINICAL DATA: Chest pain.

EXAM:
CHEST - 2 VIEW

[chest pa]
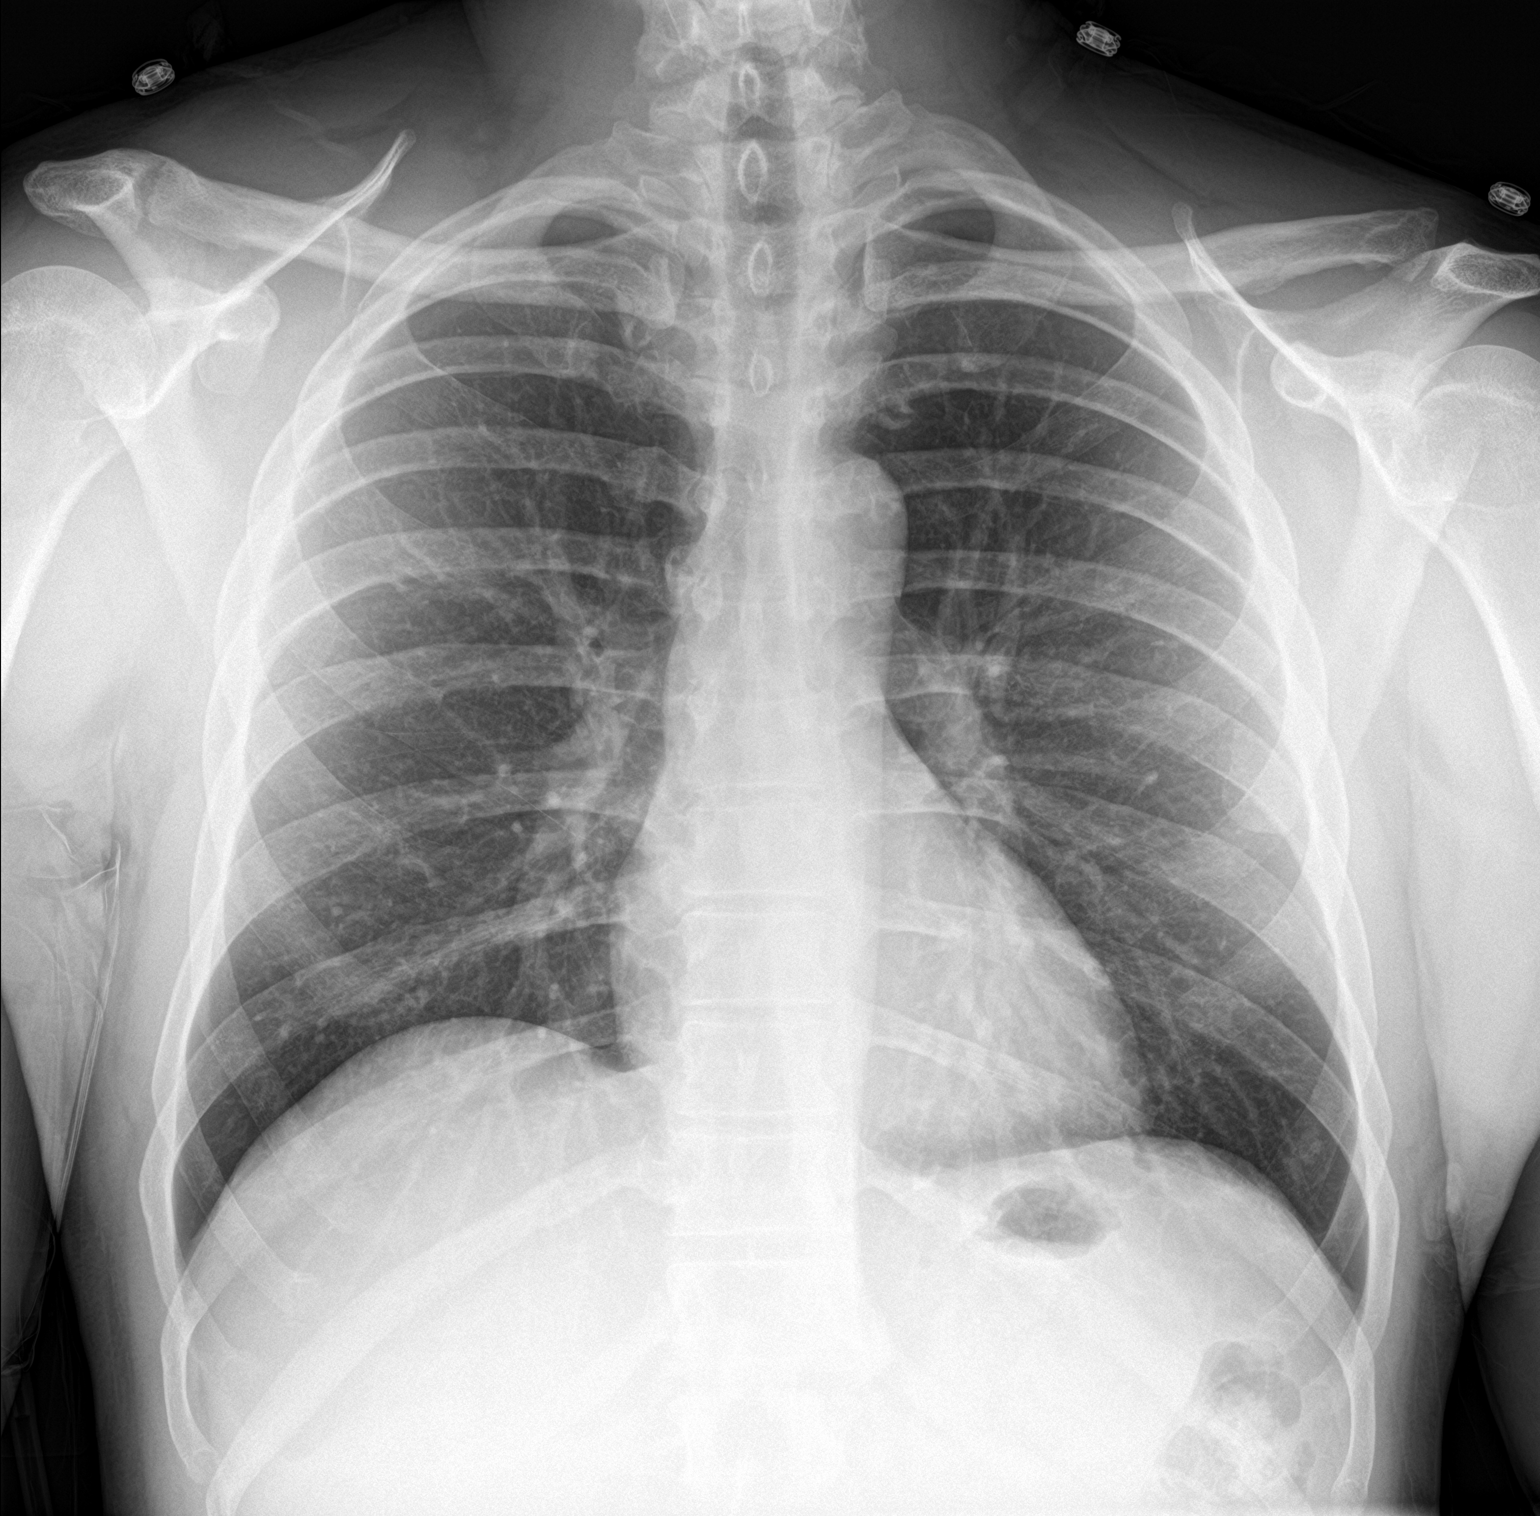

[chest lat]
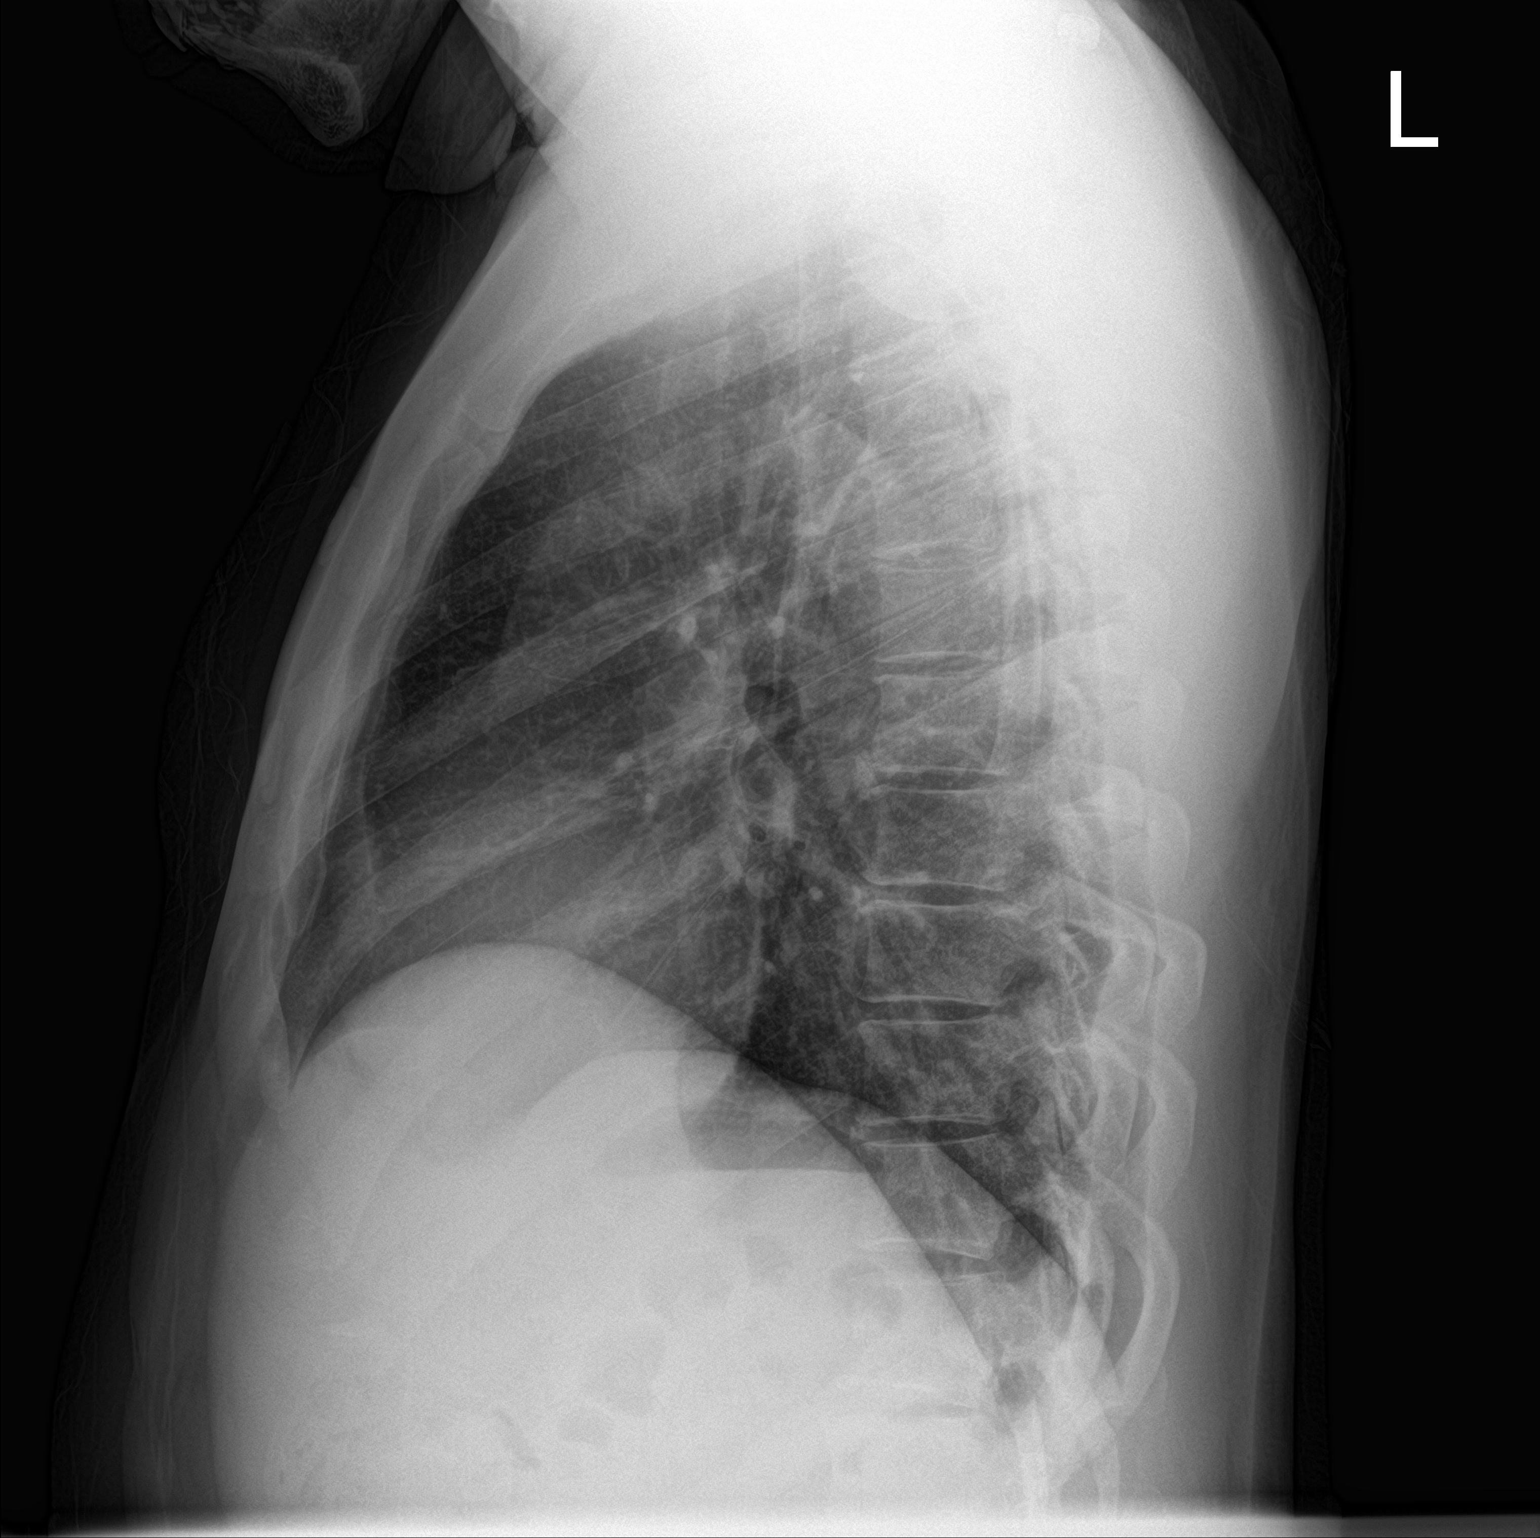

[2 of 2 positions shown; findings below may reference images not displayed]

FINDINGS: The cardiomediastinal contours are normal. The lungs are clear.
Pulmonary vasculature is normal. No consolidation, pleural effusion,
or pneumothorax. No acute osseous abnormalities are seen.
IMPRESSION: Negative radiographs of the chest.
